# Patient Record
Sex: Male | Born: 1970 | Race: White | Hispanic: No | Marital: Married | Smoking: Never smoker
Health system: Southern US, Community
[De-identification: ages and names within clinical notes are randomized; demographics above are authoritative.]

## PROBLEM LIST (undated history)

## (undated) DIAGNOSIS — K4090 Unilateral inguinal hernia, without obstruction or gangrene, not specified as recurrent: Secondary | ICD-10-CM

## (undated) HISTORY — PX: HERNIA REPAIR: SHX51

---

## 1998-10-17 ENCOUNTER — Ambulatory Visit (HOSPITAL_COMMUNITY): Admission: RE | Admit: 1998-10-17 | Discharge: 1998-10-17 | Payer: Self-pay | Admitting: *Deleted

## 2016-09-01 ENCOUNTER — Ambulatory Visit (INDEPENDENT_AMBULATORY_CARE_PROVIDER_SITE_OTHER): Payer: BLUE CROSS/BLUE SHIELD

## 2016-09-01 ENCOUNTER — Ambulatory Visit (INDEPENDENT_AMBULATORY_CARE_PROVIDER_SITE_OTHER): Payer: Self-pay | Admitting: Sports Medicine

## 2016-09-01 DIAGNOSIS — M5412 Radiculopathy, cervical region: Secondary | ICD-10-CM

## 2016-09-01 DIAGNOSIS — M50122 Cervical disc disorder at C5-C6 level with radiculopathy: Secondary | ICD-10-CM | POA: Diagnosis not present

## 2016-09-01 MED ORDER — PREDNISONE 50 MG PO TABS: ORAL_TABLET | ORAL | 0 refills | Status: DC

## 2016-09-01 MED ORDER — MELOXICAM 15 MG PO TABS: ORAL_TABLET | ORAL | 3 refills | Status: DC

## 2016-09-01 NOTE — Assessment & Plan Note (Signed)
Left C7 distribution radiculopathy, he is having some weakness but not sure if this is progressive. X-rays, formal physical therapy, prednisone, Mobic. Return to see me in one month, he will call back sooner if weakness becomes progressive and we will proceed with early MRI.

## 2016-09-01 NOTE — Progress Notes (Signed)
   Subjective:    I'm seeing this patient as a consultation for:  Dr. 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 Moderate, persistent.  He does endorse some weakness, he does not however think it's progressive.  A lot of his symptoms occur at night with extension of the neck.  Past medical history:  Negative.  See flowsheet/record as well for more information.  Surgical history: Negative.  See flowsheet/record as well for more information.  Family history: Negative.  See flowsheet/record as well for more information.  Social history: Negative.  See flowsheet/record as well for more information.  Allergies, and medications have been entered into the medical record, reviewed, and no changes needed.   Review of Systems: No headache, visual changes, nausea, vomiting, diarrhea, constipation, dizziness, abdominal pain, skin rash, fevers, chills, night sweats, weight loss, swollen lymph nodes, body aches, joint swelling, muscle aches, chest pain, shortness of breath, mood changes, visual or auditory hallucinations.   Objective:   General: Well Developed, well nourished, and in no acute distress.  Neuro/Psych: Alert and oriented x3, extra-ocular muscles intact, able to move all 4 extremities, sensation grossly intact. Skin: Warm and dry, no rashes noted.  Respiratory: Not using accessory muscles, speaking in full sentences, trachea midline.  Cardiovascular: Pulses palpable, no extremity edema. Abdomen: Does not appear distended. Neck: Negative spurling's Full neck range of motion Grip strength and sensation normal in bilateral hands Strength good C4 to T1 distribution No sensory change to C4 to T1 Reflexes normal  X-rays personally reviewed and shows C5-C6 and C6-C7 degenerative disc disease.  Impression and Recommendations:     This case required medical decision making of moderate complexity.  Left cervical radiculopathy Left C7 distribution radiculopathy, he is having some weakness but not sure if this is progressive. X-rays, formal physical therapy, prednisone, Mobic. Return to see me in one month, he will call back sooner if weakness becomes progressive and we will proceed with early MRI.

## 2016-09-08 ENCOUNTER — Telehealth: Payer: Self-pay | Admitting: Sports Medicine

## 2016-09-08 MED ORDER — GABAPENTIN 300 MG PO CAPS: ORAL_CAPSULE | ORAL | 3 refills | Status: DC

## 2016-09-08 NOTE — Telephone Encounter (Signed)
Discussed with patient in the hallway, persistent symptoms despite prednisone, adding cervical rehabilitation exercises as well as gabapentin in a slow up taper, if insufficient improvement after another 3 weeks we will proceed with MRI for interventional planning.

## 2016-09-29 ENCOUNTER — Ambulatory Visit: Payer: Self-pay | Admitting: Sports Medicine

## 2016-12-30 ENCOUNTER — Other Ambulatory Visit: Payer: Self-pay | Admitting: Sports Medicine

## 2017-01-10 ENCOUNTER — Other Ambulatory Visit: Payer: Self-pay | Admitting: Sports Medicine

## 2017-01-10 DIAGNOSIS — M5412 Radiculopathy, cervical region: Secondary | ICD-10-CM

## 2017-02-04 DIAGNOSIS — H524 Presbyopia: Secondary | ICD-10-CM | POA: Diagnosis not present

## 2017-05-11 ENCOUNTER — Other Ambulatory Visit: Payer: Self-pay | Admitting: Sports Medicine

## 2017-05-11 DIAGNOSIS — M5412 Radiculopathy, cervical region: Secondary | ICD-10-CM

## 2017-08-25 ENCOUNTER — Ambulatory Visit (INDEPENDENT_AMBULATORY_CARE_PROVIDER_SITE_OTHER): Payer: BLUE CROSS/BLUE SHIELD

## 2017-08-25 DIAGNOSIS — Z23 Encounter for immunization: Secondary | ICD-10-CM | POA: Diagnosis not present

## 2018-06-10 ENCOUNTER — Encounter: Payer: Self-pay | Admitting: Sports Medicine

## 2018-06-10 ENCOUNTER — Ambulatory Visit (INDEPENDENT_AMBULATORY_CARE_PROVIDER_SITE_OTHER): Payer: BLUE CROSS/BLUE SHIELD | Admitting: Sports Medicine

## 2018-06-10 DIAGNOSIS — K4090 Unilateral inguinal hernia, without obstruction or gangrene, not specified as recurrent: Secondary | ICD-10-CM

## 2018-06-10 NOTE — Progress Notes (Signed)
Subjective:    I'm seeing this patient as a consultation for: Dr. 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  When he was a child he had an inguinal hernia repair on the left.  Did well, more recently he is noted a progressive bulge in his left lower abdomen, no symptoms of obstruction, no pain, when it bulges on Valsalva he is able to push it back in himself with his hand.  No radiation into the scrotum, but it does have a burning sensation.  Symptoms are moderate, persistent.  I reviewed the past medical history, family history, social history, surgical history, and allergies today and no changes were needed.  Please see the problem list section below in epic for further details.  Past Medical History: History reviewed. No pertinent past medical history. Past Surgical History: Past Surgical History:  Procedure Laterality Date  . HERNIA REPAIR     Social History: Social History   Socioeconomic History  . Marital status: Married    Spouse name: Not on file  . Number of children: Not on file  . Years of education: Not on file  . Highest education level: Not on file  Occupational History  . Not on file  Social Needs  . Financial resource strain: Not on file  . Food insecurity:    Worry: Not on file    Inability: Not on file  . Transportation needs:    Medical: Not on file    Non-medical: Not on file  Tobacco Use  . Smoking status: Not on file  Substance and Sexual Activity  . Alcohol use: Not on file  . Drug use: Not on file  . Sexual activity: Not on file  Lifestyle  . Physical activity:    Days per week: Not on file    Minutes per session: Not on file  . Stress: Not on file  Relationships  . Social connections:    Talks on phone: Not on file    Gets together: Not on file    Attends religious service: Not on file    Active member of club or organization: Not on file    Attends meetings of clubs  or organizations: Not on file    Relationship status: Not on file  Other Topics Concern  . Not on file  Social History Narrative  . Not on file   Family History: No family history on file. Allergies: No Known Allergies Medications: See med rec.  Review of Systems: No headache, visual changes, nausea, vomiting, diarrhea, constipation, dizziness, abdominal pain, skin rash, fevers, chills, night sweats, weight loss, swollen lymph nodes, body aches, joint swelling, muscle aches, chest pain, shortness of breath, mood changes, visual or auditory hallucinations.   Objective:   General: Well Developed, well nourished, and in no acute distress.  Neuro:  Extra-ocular muscles intact, able to move all 4 extremities, sensation grossly intact.  Deep tendon reflexes tested were normal. Psych: Alert and oriented, mood congruent with affect. ENT:  Ears and nose appear unremarkable.  Hearing grossly normal. Neck: Unremarkable overall appearance, trachea midline.  No visible thyroid enlargement. Eyes: Conjunctivae and lids appear unremarkable.  Pupils equal and round. Skin: Warm and dry, no rashes noted.  Cardiovascular: Pulses palpable, no extremity edema. Abdomen: Soft, nontender, nondistended, normal bowel sounds, palpable left lower quadrant mass that feels to be a direct inguinal hernia that is reducible.  No audible bowel sounds.  No tenderness, no  rebound, no guarding, no rigidity.  Impression and Recommendations:   This case required medical decision making of moderate complexity.  Inguinal hernia, left Direct left inguinal hernia, reducible. Nontender. No signs or symptoms of obstruction. Referral to general surgery for correction. ___________________________________________ Ihor Austin. Benjamin Stain, M.D., ABFM., CAQSM. Primary Care and Sports Medicine [facility]  Adjunct Instructor of Family Medicine  [employer]

## 2018-06-10 NOTE — Assessment & Plan Note (Signed)
Direct left inguinal hernia, reducible. Nontender. No signs or symptoms of obstruction. Referral to general surgery for correction.

## 2018-06-13 ENCOUNTER — Ambulatory Visit: Payer: Self-pay | Admitting: General Surgery

## 2018-06-13 DIAGNOSIS — K4090 Unilateral inguinal hernia, without obstruction or gangrene, not specified as recurrent: Secondary | ICD-10-CM | POA: Diagnosis not present

## 2018-10-30 DIAGNOSIS — Z87891 Personal history of nicotine dependence: Secondary | ICD-10-CM | POA: Diagnosis not present

## 2018-10-30 DIAGNOSIS — Z79899 Other long term (current) drug therapy: Secondary | ICD-10-CM | POA: Diagnosis not present

## 2018-10-30 DIAGNOSIS — R1011 Right upper quadrant pain: Secondary | ICD-10-CM | POA: Diagnosis not present

## 2018-10-30 DIAGNOSIS — I10 Essential (primary) hypertension: Secondary | ICD-10-CM | POA: Diagnosis not present

## 2018-10-30 DIAGNOSIS — R110 Nausea: Secondary | ICD-10-CM | POA: Diagnosis not present

## 2018-11-01 DIAGNOSIS — R1011 Right upper quadrant pain: Secondary | ICD-10-CM | POA: Diagnosis not present

## 2018-11-10 ENCOUNTER — Other Ambulatory Visit: Payer: Self-pay | Admitting: Surgery

## 2018-11-10 DIAGNOSIS — R1011 Right upper quadrant pain: Secondary | ICD-10-CM

## 2018-11-10 DIAGNOSIS — J1089 Influenza due to other identified influenza virus with other manifestations: Secondary | ICD-10-CM | POA: Diagnosis not present

## 2018-11-11 ENCOUNTER — Ambulatory Visit
Admission: RE | Admit: 2018-11-11 | Discharge: 2018-11-11 | Disposition: A | Discharge status: Home / Self Care | Payer: BLUE CROSS/BLUE SHIELD | Source: Ambulatory Visit | Attending: Surgery | Admitting: Surgery

## 2018-11-11 DIAGNOSIS — R1011 Right upper quadrant pain: Secondary | ICD-10-CM | POA: Diagnosis not present

## 2018-11-29 ENCOUNTER — Other Ambulatory Visit (HOSPITAL_COMMUNITY): Payer: Self-pay | Admitting: Surgery

## 2018-11-29 ENCOUNTER — Other Ambulatory Visit: Payer: Self-pay | Admitting: Surgery

## 2018-11-29 DIAGNOSIS — R1011 Right upper quadrant pain: Secondary | ICD-10-CM

## 2018-12-07 ENCOUNTER — Ambulatory Visit (HOSPITAL_COMMUNITY)
Admission: RE | Admit: 2018-12-07 | Discharge: 2018-12-07 | Disposition: A | Discharge status: Home / Self Care | Payer: BLUE CROSS/BLUE SHIELD | Source: Ambulatory Visit | Attending: Surgery | Admitting: Surgery

## 2018-12-07 ENCOUNTER — Other Ambulatory Visit: Payer: Self-pay

## 2018-12-07 DIAGNOSIS — R1011 Right upper quadrant pain: Secondary | ICD-10-CM | POA: Diagnosis not present

## 2018-12-07 MED ORDER — TECHNETIUM TC 99M MEBROFENIN IV KIT
5.0000 | PACK | Freq: Once | INTRAVENOUS | Status: AC | PRN
  Administered 2018-12-07: 5 via INTRAVENOUS

## 2019-01-20 ENCOUNTER — Telehealth: Payer: Self-pay | Admitting: Physician Assistant

## 2019-01-20 DIAGNOSIS — Z20828 Contact with and (suspected) exposure to other viral communicable diseases: Secondary | ICD-10-CM

## 2019-01-20 DIAGNOSIS — Z20822 Contact with and (suspected) exposure to covid-19: Secondary | ICD-10-CM

## 2019-01-20 NOTE — Telephone Encounter (Signed)
Pt's wife calls in for IGG testing for COVID. 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 Currently asymptomatic.

## 2019-04-26 DIAGNOSIS — Z20828 Contact with and (suspected) exposure to other viral communicable diseases: Secondary | ICD-10-CM | POA: Diagnosis not present

## 2019-06-28 DIAGNOSIS — Z23 Encounter for immunization: Secondary | ICD-10-CM | POA: Diagnosis not present

## 2019-06-28 DIAGNOSIS — Z125 Encounter for screening for malignant neoplasm of prostate: Secondary | ICD-10-CM | POA: Diagnosis not present

## 2019-06-28 DIAGNOSIS — Z13220 Encounter for screening for lipoid disorders: Secondary | ICD-10-CM | POA: Diagnosis not present

## 2019-06-28 DIAGNOSIS — K760 Fatty (change of) liver, not elsewhere classified: Secondary | ICD-10-CM | POA: Diagnosis not present

## 2019-06-28 DIAGNOSIS — N529 Male erectile dysfunction, unspecified: Secondary | ICD-10-CM | POA: Diagnosis not present

## 2019-08-17 DIAGNOSIS — H401122 Primary open-angle glaucoma, left eye, moderate stage: Secondary | ICD-10-CM | POA: Diagnosis not present

## 2020-03-16 IMAGING — NM NUCLEAR MEDICINE HEPATOBILIARY IMAGING WITH GALLBLADDER EF
2 series · 12 of 12 positions shown · non-contrast
Comparison: None.

CLINICAL DATA: Right upper quadrant pain and nausea

EXAM:
NUCLEAR MEDICINE HEPATOBILIARY IMAGING WITH GALLBLADDER EF
VIEWS:
Anterior right upper quadrant
RADIOPHARMACEUTICALS:  5.1 mCi Xc-66m  Choletec IV

[he hepatobiliary · 4.52mm/px · 6 of 60 frames shown (1 of 2)]
[frame 6/60]
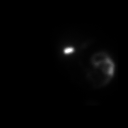
[frame 16/60]
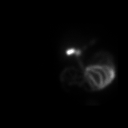
[frame 26/60]
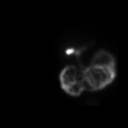
[frame 36/60]
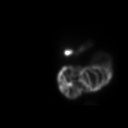
[frame 46/60]
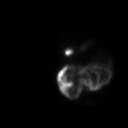
[frame 56/60]
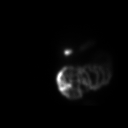

[he hepatobiliary · 4.52mm/px · 6 of 60 frames shown (2 of 2)]
[frame 6/60]
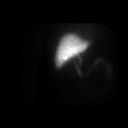
[frame 16/60]
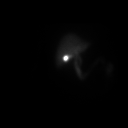
[frame 26/60]
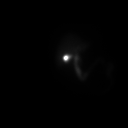
[frame 36/60]
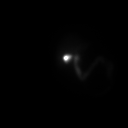
[frame 46/60]
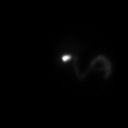
[frame 56/60]
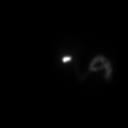

[12 of 12 positions shown; findings below may reference images not displayed]

FINDINGS: Liver uptake of radiotracer is homogeneous. There is prompt
visualization of gallbladder and small bowel, indicating patency of
the cystic and common bile ducts. The patient consumed 8 ounces of
Ensure Enlive orally with calculation of the computer generated
ejection fraction of radiotracer from the gallbladder. The patient
did not experience clinical symptoms with the oral Ensure
consumption. The computer generated ejection fraction of radiotracer
from the gallbladder is normal at 71%, normal greater than 33% using
the oral agent.
IMPRESSION: Study within normal limits.

## 2020-06-26 IMAGING — US US ABDOMEN LIMITED
1 series · 14 of 25 positions shown · non-contrast
Comparison: None.

CLINICAL DATA: Right upper quadrant abdominal pain.

EXAM:
ULTRASOUND ABDOMEN LIMITED RIGHT UPPER QUADRANT

[Series 1: us abdomen limited · 0.22mm/px · 14 of 42 slices shown]
[im 1/42]
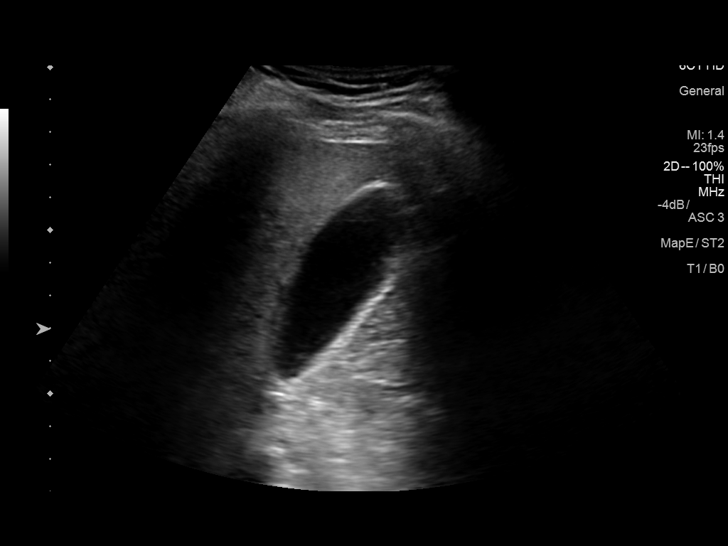
[im 4/42]
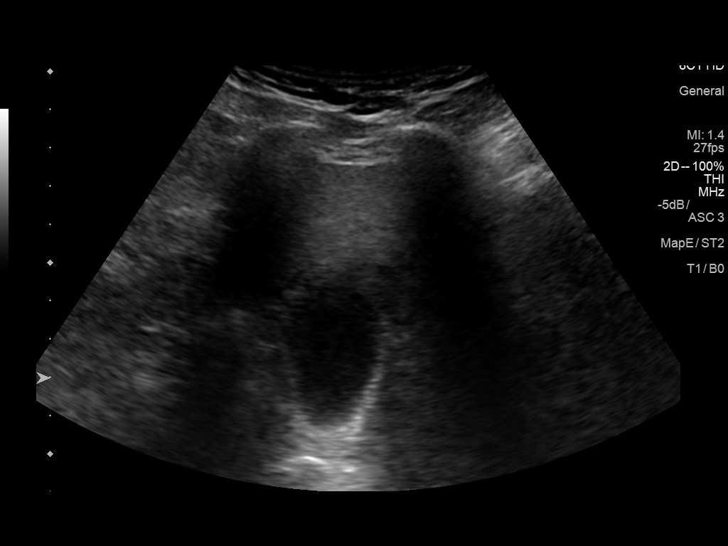
[im 7/42]
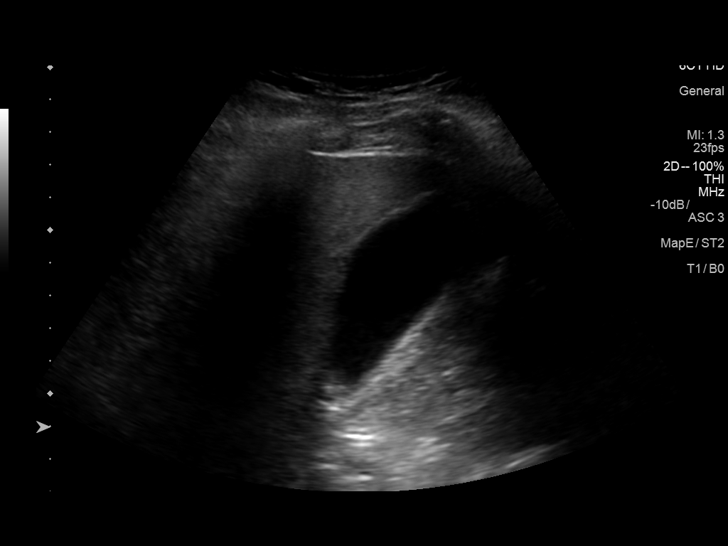
[im 11/42]
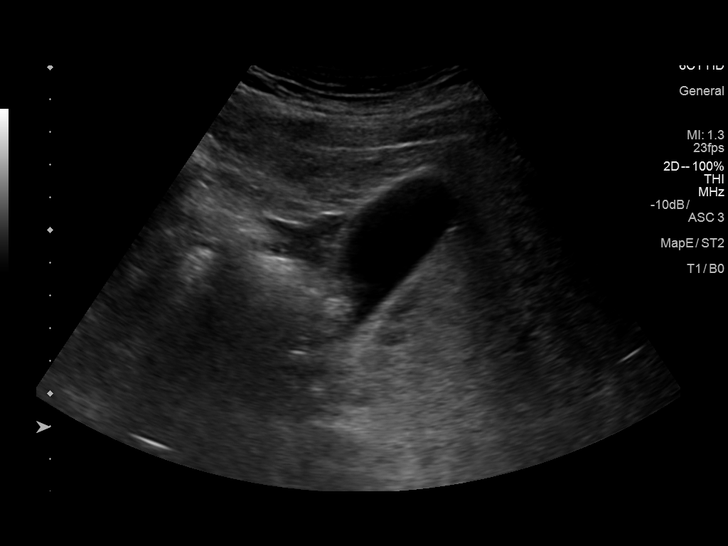
[im 14/42]
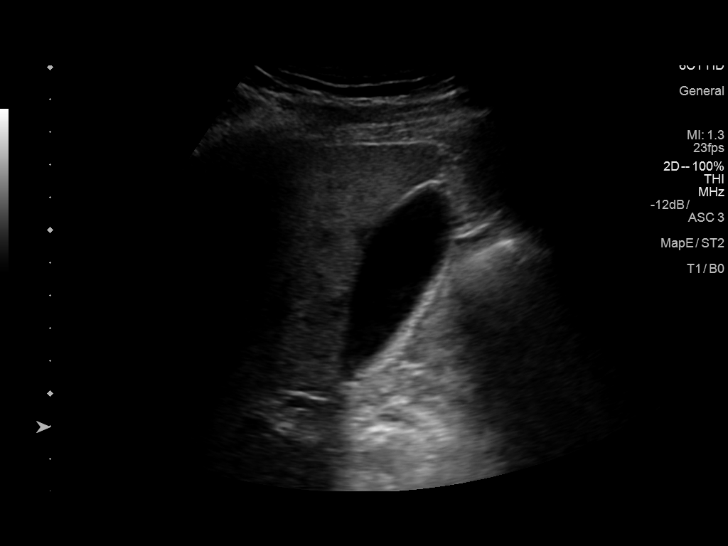
[im 16/42]
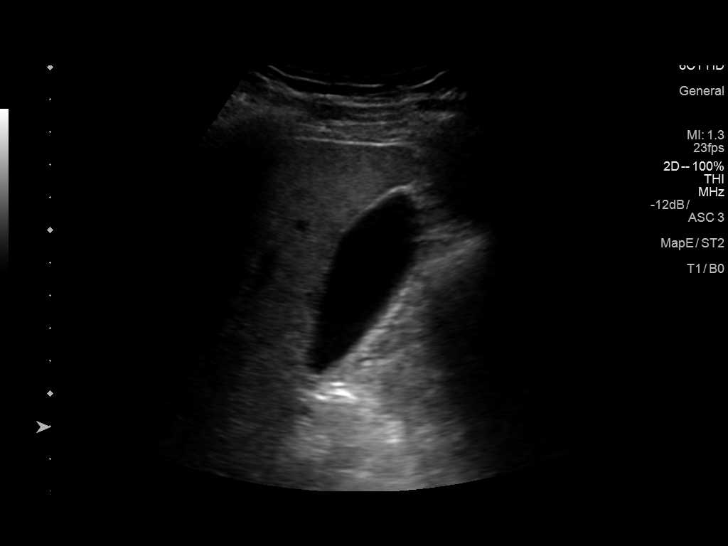
[im 19/42]
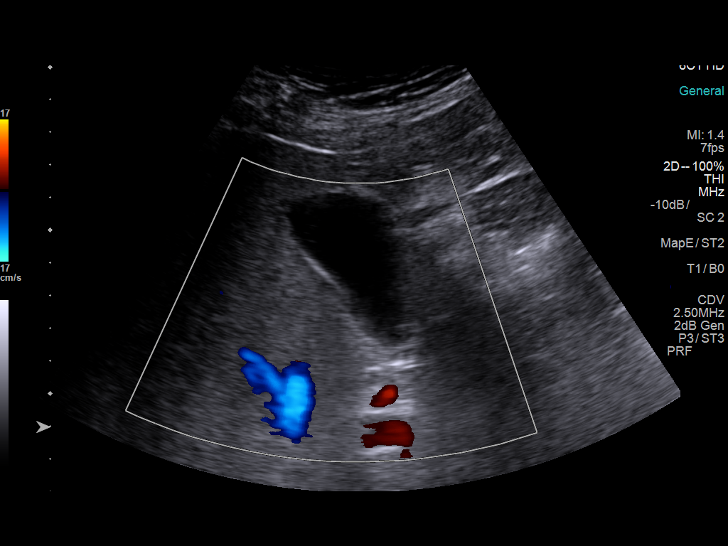
[im 23/42]
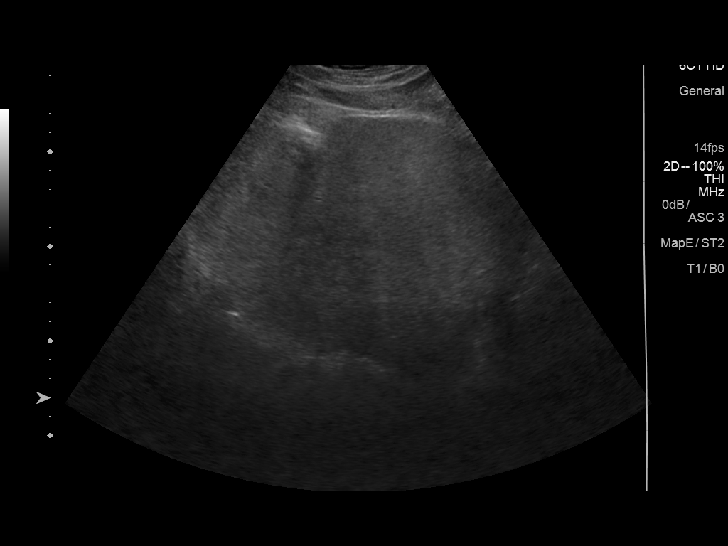
[im 26/42]
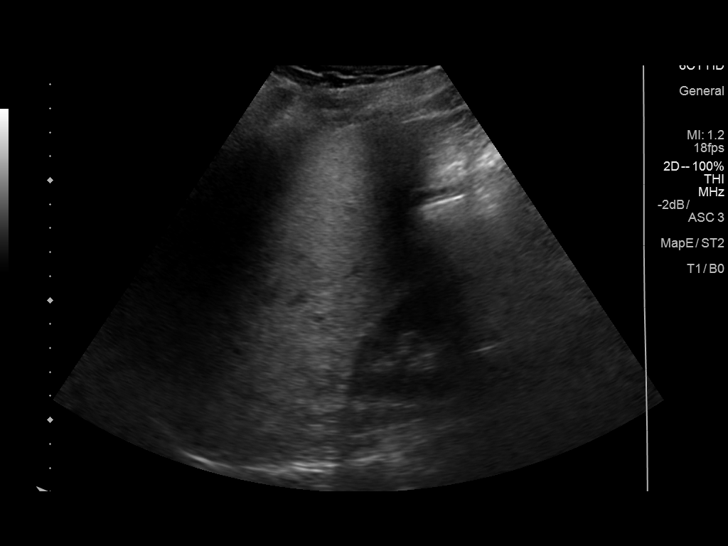
[im 28/42]
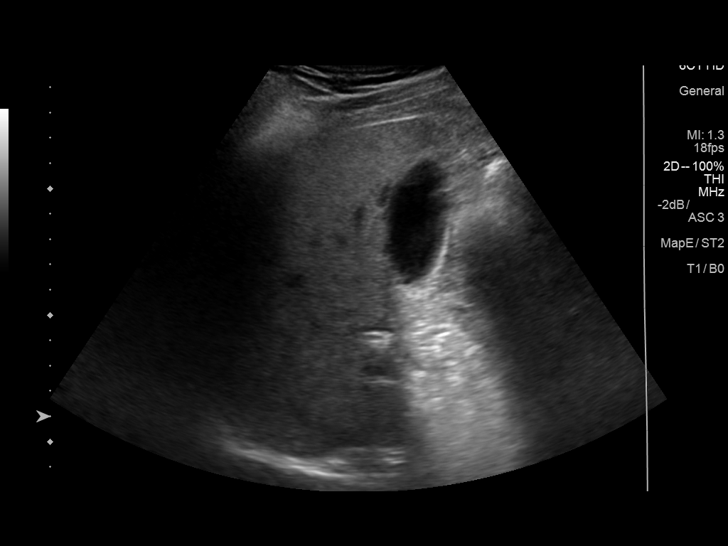
[im 31/42]
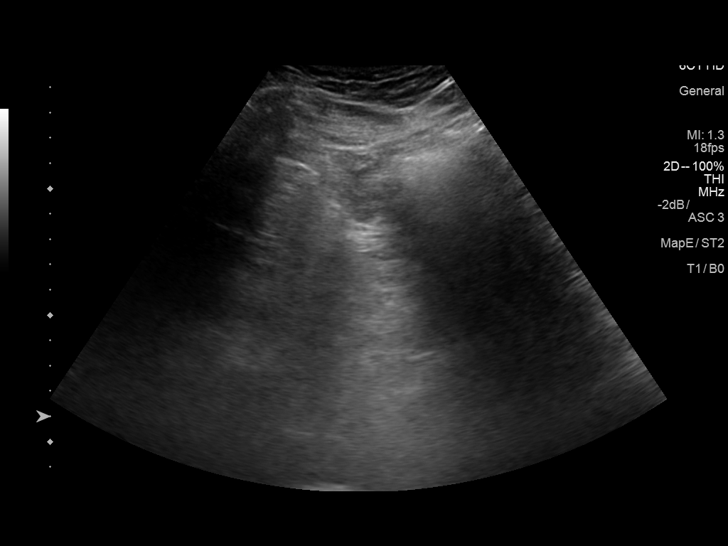
[im 35/42]
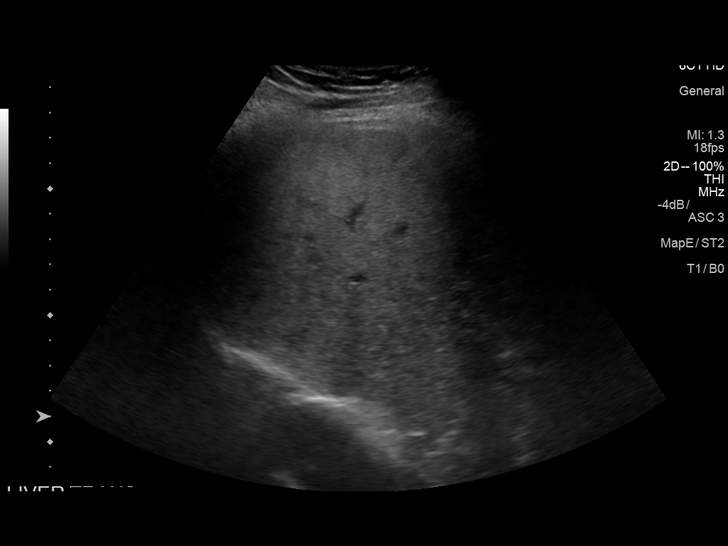
[im 38/42]
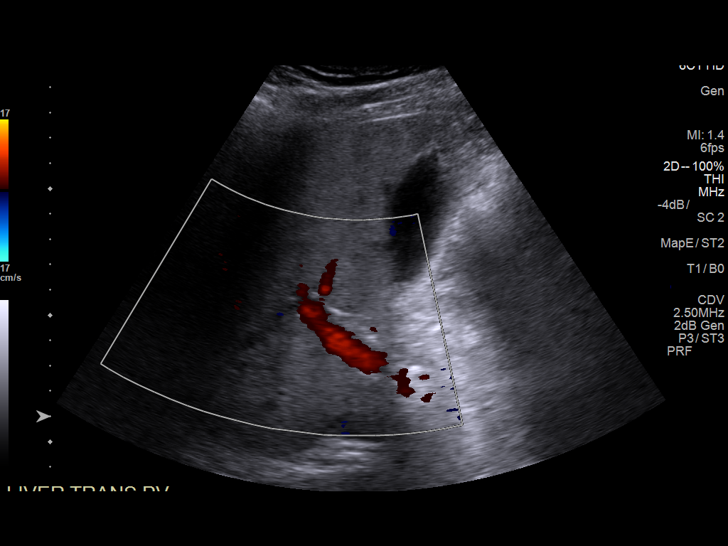
[im 42/42]
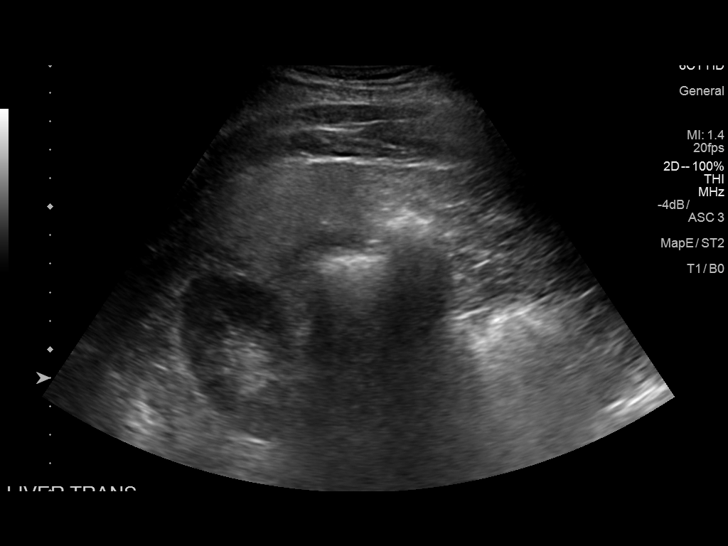

[14 of 25 positions shown; findings below may reference images not displayed]

FINDINGS: Gallbladder:

No gallstones or wall thickening visualized. No sonographic Murphy
sign noted by sonographer.

Common bile duct:

Diameter: 2 mm which is within normal limits.

Liver:

Increased echogenicity of hepatic parenchyma is noted suggesting
hepatic steatosis. Possible fatty sparing is seen adjacent to
gallbladder fossa. Portal vein is patent on color Doppler imaging
with normal direction of blood flow towards the liver.
IMPRESSION: Probable hepatic steatosis. No other significant abnormality seen in
the right upper quadrant of the abdomen.

## 2020-12-25 ENCOUNTER — Emergency Department: Admit: 2020-12-25 | Discharge status: Absent without leave | Payer: Self-pay

## 2021-05-19 ENCOUNTER — Emergency Department (INDEPENDENT_AMBULATORY_CARE_PROVIDER_SITE_OTHER): Payer: BLUE CROSS/BLUE SHIELD

## 2021-05-19 ENCOUNTER — Other Ambulatory Visit: Payer: Self-pay

## 2021-05-19 ENCOUNTER — Emergency Department (INDEPENDENT_AMBULATORY_CARE_PROVIDER_SITE_OTHER)
Admission: EM | Admit: 2021-05-19 | Discharge: 2021-05-19 | Disposition: A | Discharge status: Home / Self Care | Payer: BLUE CROSS/BLUE SHIELD | Source: Home / Self Care

## 2021-05-19 DIAGNOSIS — L03115 Cellulitis of right lower limb: Secondary | ICD-10-CM | POA: Diagnosis not present

## 2021-05-19 DIAGNOSIS — M25561 Pain in right knee: Secondary | ICD-10-CM

## 2021-05-19 DIAGNOSIS — M25461 Effusion, right knee: Secondary | ICD-10-CM

## 2021-05-19 HISTORY — DX: Unilateral inguinal hernia, without obstruction or gangrene, not specified as recurrent: K40.90

## 2021-05-19 MED ORDER — BACLOFEN 10 MG PO TABS: 10.0000 mg | ORAL_TABLET | Freq: Three times a day (TID) | ORAL | 0 refills | Status: DC

## 2021-05-19 MED ORDER — SULFAMETHOXAZOLE-TRIMETHOPRIM 800-160 MG PO TABS: 1.0000 | ORAL_TABLET | Freq: Two times a day (BID) | ORAL | 0 refills | Status: DC

## 2021-05-19 MED ORDER — IBUPROFEN 800 MG PO TABS: 800.0000 mg | ORAL_TABLET | Freq: Three times a day (TID) | ORAL | 0 refills | Status: AC

## 2021-05-19 NOTE — ED Provider Notes (Signed)
[name]    CSN: [id number] Arrival date & time: 05/19/21  0808      History   Chief Complaint Chief Complaint  Patient presents with  . Knee Pain    Right    HPI John Joyce is a 50 y.o. male.   HPI 50 year old male presents with right knee pain for 3 days secondary to injury watch what she hit right knee on concrete wall.  Reports swelling and bruising noted to right knee.  Past Medical History:  Diagnosis Date  . Inguinal hernia     Patient Active Problem List   Diagnosis Date Noted  . Inguinal hernia, left 06/10/2018  . Left cervical radiculopathy 09/01/2016    History reviewed. No pertinent surgical history.     Home Medications    Prior to Admission medications   Medication Sig Start Date End Date Taking? Authorizing Provider  baclofen (LIORESAL) 10 MG tablet Take 1 tablet (10 mg total) by mouth 3 (three) times daily. 05/19/21  Yes Trevor Iha, FNP  ibuprofen (ADVIL) 800 MG tablet Take 1 tablet (800 mg total) by mouth 3 (three) times daily. 05/19/21  Yes Trevor Iha, FNP  sulfamethoxazole-trimethoprim (BACTRIM DS) 800-160 MG tablet Take 1 tablet by mouth 2 (two) times daily for 7 days. 05/19/21 05/26/21 Yes Trevor Iha, FNP    Family History History reviewed. No pertinent family history.  Social History Social History   Tobacco Use  . Smoking status: Never  . Smokeless tobacco: Never  Vaping Use  . Vaping Use: Never used  Substance Use Topics  . Alcohol use: Not Currently  . Drug use: Not Currently     Allergies   Patient has no known allergies.   Review of Systems Review of Systems  Musculoskeletal:        Right knee pain x 3 days    Physical Exam Triage Vital Signs ED Triage Vitals  Enc Vitals Group     BP 05/19/21 0829 125/82     Pulse Rate 05/19/21 0829 65     Resp 05/19/21 0829 20     Temp 05/19/21 0829 98.7 F (37.1 C)     Temp Source 05/19/21 0829 Oral     SpO2 05/19/21 0829 99 %     Weight  05/19/21 0823 200 lb (90.7 kg)     Height 05/19/21 0823 5\' 8"  (1.727 m)     Head Circumference --      Peak Flow --      Pain Score 05/19/21 0823 6     Pain Loc --      Pain Edu? --      Excl. in GC? --    No data found.  Updated Vital Signs BP 125/82 (BP Location: Right Arm)   Pulse 65   Temp 98.7 F (37.1 C) (Oral)   Resp 20   Ht 5\' 8"  (1.727 m)   Wt 200 lb (90.7 kg)   SpO2 99%   BMI 30.41 kg/m      Physical Exam Vitals and nursing note reviewed.  Constitutional:      General: He is not in acute distress.    Appearance: Normal appearance. He is normal weight. He is not ill-appearing.  HENT:     Head: Normocephalic and atraumatic.     Mouth/Throat:     Mouth: Mucous membranes are moist.     Pharynx: Oropharynx is clear.  Eyes:     Extraocular Movements: Extraocular movements intact.  Conjunctiva/sclera: Conjunctivae normal.     Pupils: Pupils are equal, round, and reactive to light.  Cardiovascular:     Rate and Rhythm: Normal rate and regular rhythm.     Pulses: Normal pulses.     Heart sounds: Normal heart sounds.  Pulmonary:     Effort: Pulmonary effort is normal.     Breath sounds: Normal breath sounds. No wheezing, rhonchi or rales.  Musculoskeletal:     Cervical back: Normal range of motion and neck supple.     Comments: Right knee: TTP over superior edge of patella, moderate to significant soft tissue swelling noted  Skin:    General: Skin is warm and dry.     Comments: Right knee (central patella): ~3.0 cm x3.0 cm circular shaped area erythematous, indurated, fluctuant  Neurological:     General: No focal deficit present.     Mental Status: He is alert and oriented to person, place, and time. Mental status is at baseline.  Psychiatric:        Mood and Affect: Mood normal.        Behavior: Behavior normal.        Thought Content: Thought content normal.     UC Treatments / Results  Labs (all labs ordered are listed, but only abnormal results  are displayed) Labs Reviewed - No data to display  EKG   Radiology DG Knee AP/LAT W/Sunrise Right  Result Date: 05/19/2021 CLINICAL DATA:  Right knee pain.  Warm to touch. EXAM: RIGHT KNEE 3 VIEWS COMPARISON:  None. FINDINGS: The knee is located.  No acute or healing fractures are present. Marked prepatellar soft tissue swelling is present. A small joint effusion is noted as well. IMPRESSION: 1. Marked prepatellar soft tissue swelling. This raises concern for soft tissue swelling or cellulitis. 2. Small joint effusion. This may be reactive. Ligamentous injury or infection is not excluded. Electronically Signed   By: Marin Roberts M.D.   On: 05/19/2021 08:48    Procedures Procedures (including critical care time)  Medications Ordered in UC Medications - No data to display  Initial Impression / Assessment and Plan / UC Course  I have reviewed the triage vital signs and the nursing notes.  Pertinent labs & imaging results that were available during my care of the patient were reviewed by me and considered in my medical decision making (see chart for details).    MDM: 1.  Acute pain of right knee-knee x-ray revealed above, Rx'd Ibuprofen; 2.  Effusion of right knee-advised patient to RICE right knee; 3. Cellulitis of right knee-Rx'd Bactrim; Advised/instructed patient to take medication as directed with food to completion.  Encouraged patient to increase daily water intake while taking these medications.  Advised/instructed patient to RICE right knee for 25 minutes 3 times daily for the next 3 days.  Advised/encouraged patient to avoid repetitive motion and/or moderately strenuous activities holding right knee for the next 3 to 7 days.  Patient discharged home, hemodynamically stable.  Final Clinical Impressions(s) / UC Diagnoses   Final diagnoses:  Acute pain of right knee  Effusion of right knee  Cellulitis of right knee     Discharge Instructions      Advised/instructed  patient to take medication as directed with food to completion.  Encouraged patient to increase daily water intake while taking these medications.  Advised/instructed patient to RICE right knee for 25 minutes 3 times daily for the next 3 days.  Advised/encouraged patient to avoid repetitive motion and/or moderately  strenuous activities holding right knee for the next 3 to 7 days.     ED Prescriptions     Medication Sig Dispense Auth. Provider   ibuprofen (ADVIL) 800 MG tablet Take 1 tablet (800 mg total) by mouth 3 (three) times daily. 30 tablet Trevor Iha, FNP   sulfamethoxazole-trimethoprim (BACTRIM DS) 800-160 MG tablet Take 1 tablet by mouth 2 (two) times daily for 7 days. 14 tablet Trevor Iha, FNP   baclofen (LIORESAL) 10 MG tablet Take 1 tablet (10 mg total) by mouth 3 (three) times daily. 30 each Trevor Iha, FNP      PDMP not reviewed this encounter.   Trevor Iha, FNP 05/19/21 [phone]

## 2021-05-19 NOTE — Discharge Instructions (Addendum)
Advised/instructed patient to take medication as directed with food to completion.  Encouraged patient to increase daily water intake while taking these medications.  Advised/instructed patient to RICE right knee for 25 minutes 3 times daily for the next 3 days.  Advised/encouraged patient to avoid repetitive motion and/or moderately strenuous activities holding right knee for the next 3 to 7 days.

## 2021-05-19 NOTE — ED Triage Notes (Signed)
Pt presents to Urgent Care with c/o R knee pain following injury 3 days ago. Reports hitting knee on a concrete wall. Swelling and bruising noted to R kneecap.

## 2021-05-21 ENCOUNTER — Other Ambulatory Visit: Payer: Self-pay | Admitting: Sports Medicine

## 2021-05-21 ENCOUNTER — Emergency Department
Admission: EM | Admit: 2021-05-21 | Discharge: 2021-05-21 | Disposition: A | Discharge status: Home / Self Care | Payer: BLUE CROSS/BLUE SHIELD | Source: Home / Self Care

## 2021-05-21 ENCOUNTER — Encounter: Payer: Self-pay | Admitting: Emergency Medicine

## 2021-05-21 ENCOUNTER — Other Ambulatory Visit: Payer: Self-pay

## 2021-05-21 ENCOUNTER — Emergency Department (INDEPENDENT_AMBULATORY_CARE_PROVIDER_SITE_OTHER): Payer: BLUE CROSS/BLUE SHIELD

## 2021-05-21 DIAGNOSIS — M71161 Other infective bursitis, right knee: Secondary | ICD-10-CM

## 2021-05-21 DIAGNOSIS — M7041 Prepatellar bursitis, right knee: Secondary | ICD-10-CM

## 2021-05-21 MED ORDER — DOXYCYCLINE HYCLATE 100 MG PO CAPS: 100.0000 mg | ORAL_CAPSULE | Freq: Two times a day (BID) | ORAL | 0 refills | Status: DC

## 2021-05-21 NOTE — ED Provider Notes (Signed)
[facility]    CSN: [id number] Arrival date & time: 05/21/21  0347      History   Chief Complaint Chief Complaint  Patient presents with   Knee Pain    HPI John Joyce is a 50 y.o. male.   HPI Patient is here for follow-up of his right knee injury.  He states that since he was seen he has been taking the ibuprofen 3 times a day, he has been taking this up to twice a day.  He cannot take the baclofen because of drowsiness.  He states that he has been using ice.  In spite of this the knee swelling has gotten larger, more painful, and he has redness and heat on the front of his knee.  He is worried about having any infection.  He has had no fever chills body aches. Past Medical History:  Diagnosis Date   Inguinal hernia     Patient Active Problem List   Diagnosis Date Noted   Prepatellar bursitis, right knee 05/21/2021   Inguinal hernia, left 06/10/2018   Left cervical radiculopathy 09/01/2016    History reviewed. No pertinent surgical history.     Home Medications    Prior to Admission medications   Medication Sig Start Date End Date Taking? Authorizing Provider  doxycycline (VIBRAMYCIN) 100 MG capsule Take 1 capsule (100 mg total) by mouth 2 (two) times daily. 05/21/21  Yes Eustace Moore, MD  ibuprofen (ADVIL) 800 MG tablet Take 1 tablet (800 mg total) by mouth 3 (three) times daily. 05/19/21   Trevor Iha, FNP    Family History History reviewed. No pertinent family history.  Social History Social History   Tobacco Use   Smoking status: Never   Smokeless tobacco: Never  Vaping Use   Vaping Use: Never used  Substance Use Topics   Alcohol use: Not Currently   Drug use: Not Currently     Allergies   Patient has no known allergies.   Review of Systems Review of Systems See HPI  Physical Exam Triage Vital Signs ED Triage Vitals  Enc Vitals Group     BP 05/21/21 0945 133/82     Pulse Rate 05/21/21 0945 68     Resp 05/21/21  0945 18     Temp 05/21/21 0945 97.9 F (36.6 C)     Temp Source 05/21/21 0945 Oral     SpO2 05/21/21 0945 99 %     Weight 05/21/21 0946 195 lb (88.5 kg)     Height 05/21/21 0946 5\' 8"  (1.727 m)     Head Circumference --      Peak Flow --      Pain Score 05/21/21 0945 6     Pain Loc --      Pain Edu? --      Excl. in GC? --    No data found.  Updated Vital Signs BP 133/82 (BP Location: Left Arm)   Pulse 68   Temp 97.9 F (36.6 C) (Oral)   Resp 18   Ht 5\' 8"  (1.727 m)   Wt 88.5 kg   SpO2 99%   BMI 29.65 kg/m      Physical Exam Constitutional:      General: He is not in acute distress.    Appearance: Normal appearance. He is well-developed.     Comments: Muscular in appearance  HENT:     Head: Normocephalic and atraumatic.     Mouth/Throat:     Comments: Mask is  in place Eyes:     Conjunctiva/sclera: Conjunctivae normal.     Pupils: Pupils are equal, round, and reactive to light.  Cardiovascular:     Rate and Rhythm: Normal rate.  Pulmonary:     Effort: Pulmonary effort is normal. No respiratory distress.  Abdominal:     General: There is no distension.     Palpations: Abdomen is soft.  Musculoskeletal:        General: Swelling and tenderness present. Normal range of motion.     Cervical back: Normal range of motion.     Comments: Right knee has swelling in the anterior surface.  Erythema and warmth.  Tenderness over the prepatellar bursa.  No tenderness around joint line.  Patient can easily flex 90 degrees and extend fully.  No instability  Skin:    General: Skin is warm and dry.  Neurological:     General: No focal deficit present.     Mental Status: He is alert.  Psychiatric:        Mood and Affect: Mood normal.        Behavior: Behavior normal.     UC Treatments / Results  Labs (all labs ordered are listed, but only abnormal results are displayed) Labs Reviewed - No data to display  EKG   Radiology [place] LIMITED JOINT SPACE STRUCTURES LOW  RIGHT  Result Date: 05/21/2021 Procedure: Real-time Ultrasound Guided aspiration/injection of right prepatellar bursa Device: [employer] Verbal informed consent obtained. Time-out conducted. Noted mild overlying erythema, no induration, or other signs of local infection. Skin prepped in a sterile fashion. Local anesthesia: Topical Ethyl chloride. With sterile technique and under real time ultrasound guidance: I advanced an 18-gauge needle into the bursa, we aspirated approximately 5 mL of cloudy, straw-colored fluid, syringe switched and 1 cc kenalog 40, 1 cc lidocaine injected easily. Completed without difficulty Pain immediately resolved suggesting accurate placement of the medication. Advised to call if fevers/chills, erythema, induration, drainage, or persistent bleeding. Images permanently stored and available for review in the ultrasound unit. Impression: Technically successful ultrasound guided aspiration/injection.   Procedures Procedures (including critical care time)  Medications Ordered in UC Medications - No data to display  Initial Impression / Assessment and Plan / UC Course  I have reviewed the triage vital signs and the nursing notes.  Pertinent labs & imaging results that were available during my care of the patient were reviewed by me and considered in my medical decision making (see chart for details).     I consulted Dr. Benjamin Stain because patient has seen him in the past.  I thought an ultrasound would be useful in determining whether to drain the knee. He escorted the patient over to his office, performed an ultrasound and aspiration.  I returned him to my office for discharge and instructed him of his discharge instructions.  Final Clinical Impressions(s) / UC Diagnoses   Final diagnoses:  Prepatellar bursitis of right knee     Discharge Instructions      Take the doxycycline 2 times a day with food You may reduce the ibuprofen to 3 times a day as needed for  pain Continue to put ice on your knee for 20 minutes every couple hours Make an appointment to see Dr. Benjamin Stain in 1 week Call or return here for any questions or problems     ED Prescriptions     Medication Sig Dispense Auth. Provider   doxycycline (VIBRAMYCIN) 100 MG capsule Take 1 capsule (100 mg total) by mouth  2 (two) times daily. 20 capsule Eustace Moore, MD      PDMP not reviewed this encounter.   Eustace Moore, MD 05/21/21 1049

## 2021-05-21 NOTE — Assessment & Plan Note (Signed)
This is a pleasant 50 year old male, recently he noted some swelling anterior right knee. Overnight it got really hot. On exam he has a obviously inflamed prepatellar bursitis, unclear as to whether it is gouty or septic, his father does have a history of gout. We did an aspiration and injection today, we will switch him from Septra to doxycycline, he can continue ibuprofen as needed. Return to see me in about a week, cell counts, crystals, cultures will be sent off.

## 2021-05-21 NOTE — Discharge Instructions (Addendum)
Take the doxycycline 2 times a day with food You may reduce the ibuprofen to 3 times a day as needed for pain Continue to put ice on your knee for 20 minutes every couple hours Make an appointment to see Dr. Benjamin Stain in 1 week Call or return here for any questions or problems

## 2021-05-21 NOTE — ED Triage Notes (Signed)
Rt Knee pain, working on concrete, swelling and bruising. Was told to return if not getting better. 6/10 Very stiff hard to bend. Swelling has gotten worse in last two days, hard to the touch.

## 2021-05-21 NOTE — Consult Note (Signed)
     Procedures performed today:    Procedure: Real-time Ultrasound Guided aspiration/injection of right prepatellar bursa Device: [employer] Verbal informed consent obtained.  Time-out conducted.  Noted mild overlying erythema, no induration, or other signs of local infection.  Skin prepped in a sterile fashion.  Local anesthesia: Topical Ethyl chloride.  With sterile technique and under real time ultrasound guidance: I advanced an 18-gauge needle into the bursa, we aspirated approximately 5 mL of cloudy, straw-colored fluid, syringe switched and 1 cc kenalog 40, 1 cc lidocaine injected easily. Completed without difficulty  Pain immediately resolved suggesting accurate placement of the medication.  Advised to call if fevers/chills, erythema, induration, drainage, or persistent bleeding.  Images permanently stored and available for review in the ultrasound unit.  Impression: Technically successful ultrasound guided aspiration/injection.  Independent interpretation of notes and tests performed by another provider:   None.  Brief History, Exam, Impression, and Recommendations:    Prepatellar bursitis, right knee This is a pleasant 50 year old male, recently he noted some swelling anterior right knee. Overnight it got really hot. On exam he has a obviously inflamed prepatellar bursitis, unclear as to whether it is gouty or septic, his father does have a history of gout. We did an aspiration and injection today, we will switch him from Septra to doxycycline, he can continue ibuprofen as needed. Return to see me in about a week, cell counts, crystals, cultures will be sent off.    ___________________________________________ Ihor Austin. Benjamin Stain, M.D., ABFM., CAQSM. Primary Care and Sports Medicine [facility]  Adjunct Instructor of Family Medicine  [employer]

## 2021-05-27 ENCOUNTER — Ambulatory Visit (INDEPENDENT_AMBULATORY_CARE_PROVIDER_SITE_OTHER): Payer: BLUE CROSS/BLUE SHIELD | Admitting: Sports Medicine

## 2021-05-27 DIAGNOSIS — M71161 Other infective bursitis, right knee: Secondary | ICD-10-CM | POA: Diagnosis not present

## 2021-05-27 LAB — CELL COUNT AND DIFF, FLUID, OTHER
Basophils, %: 0 %
Eosinophils, %: 0 %
Lymphocytes, %: 2 %
Mesothelial, %: 0 %
Monocyte/Macrophage %: 1 %
Neutrophils, %: 97 %
Total Nucleated Cell Ct: 3142 cells/uL

## 2021-05-27 LAB — ANAEROBIC AND AEROBIC CULTURE
MICRO NUMBER:: 12345429
MICRO NUMBER:: 12345430
SPECIMEN QUALITY:: ADEQUATE
SPECIMEN QUALITY:: ADEQUATE

## 2021-05-27 LAB — SYNOVIAL FLUID, CRYSTAL

## 2021-05-27 MED ORDER — DOXYCYCLINE HYCLATE 100 MG PO CAPS: 100.0000 mg | ORAL_CAPSULE | Freq: Two times a day (BID) | ORAL | 0 refills | Status: AC

## 2021-05-27 NOTE — Progress Notes (Signed)
    Procedures performed today:    None.  Independent interpretation of notes and tests performed by another provider:   None.  Brief History, Exam, Impression, and Recommendations:    Septic prepatellar bursitis, right This pleasant 50 year old male returns, we diagnosed him with a septic prepatellar bursitis at the last visit, it grew out pansensitive Staph aureus, we did an injection, but overall things are doing well. Extending the doxycycline course for another 3 days for a 10-day total, return as needed. I have also advised warm compresses multiple times daily to increase delivery of antibiotic to the area.    ___________________________________________ Ihor Austin. Benjamin Stain, M.D., ABFM., CAQSM. Primary Care and Sports Medicine  [facility]  Adjunct Instructor of Family Medicine  [employer]

## 2021-05-27 NOTE — Assessment & Plan Note (Signed)
This pleasant 50 year old male returns, we diagnosed him with a septic prepatellar bursitis at the last visit, it grew out pansensitive Staph aureus, we did an injection, but overall things are doing well. Extending the doxycycline course for another 3 days for a 10-day total, return as needed. I have also advised warm compresses multiple times daily to increase delivery of antibiotic to the area.

## 2021-06-18 ENCOUNTER — Other Ambulatory Visit: Payer: Self-pay

## 2021-06-18 DIAGNOSIS — M71161 Other infective bursitis, right knee: Secondary | ICD-10-CM

## 2021-07-03 ENCOUNTER — Ambulatory Visit (INDEPENDENT_AMBULATORY_CARE_PROVIDER_SITE_OTHER): Payer: BLUE CROSS/BLUE SHIELD | Admitting: Sports Medicine

## 2021-07-03 DIAGNOSIS — M71161 Other infective bursitis, right knee: Secondary | ICD-10-CM

## 2021-07-03 NOTE — Progress Notes (Signed)
    Procedures performed today:    None.  Independent interpretation of notes and tests performed by another provider:   None.  Brief History, Exam, Impression, and Recommendations:    Septic prepatellar bursitis, right This pleasant 50 year old male returns, we treated him back in September for a septic prepatellar bursitis, cultures grew out pansensitive Staph aureus, we did a 10-day total course of doxycycline and symptoms resolved. Unfortunately he had a recurrence of symptoms, went to [facility] where he had an incision and drainage, packing, returns today doing a lot better, still has some erythema, swelling, there is a bit of packing left over. He will pull out an inch of packing daily, cover with gauze. Finish his antibiotics from the hospital and return to see me in a week.    ___________________________________________ Ihor Austin. Benjamin Stain, M.D., ABFM., CAQSM. Primary Care and Sports Medicine [facility]  Adjunct Instructor of Family Medicine  [employer]

## 2021-07-03 NOTE — Assessment & Plan Note (Signed)
This pleasant 50 year old male returns, we treated him back in September for a septic prepatellar bursitis, cultures grew out pansensitive Staph aureus, we did a 10-day total course of doxycycline and symptoms resolved. Unfortunately he had a recurrence of symptoms, went to [facility] where he had an incision and drainage, packing, returns today doing a lot better, still has some erythema, swelling, there is a bit of packing left over. He will pull out an inch of packing daily, cover with gauze. Finish his antibiotics from the hospital and return to see me in a week.

## 2024-05-16 ENCOUNTER — Encounter: Payer: Self-pay | Admitting: Sports Medicine
